# Patient Record
Sex: Female | Born: 2000 | Race: Black or African American | Hispanic: No | Marital: Single | Smoking: Never smoker
Health system: Southern US, Community
[De-identification: ages and names within clinical notes are randomized; demographics above are authoritative.]

## PROBLEM LIST (undated history)

## (undated) DIAGNOSIS — J302 Other seasonal allergic rhinitis: Secondary | ICD-10-CM

## (undated) HISTORY — DX: Other seasonal allergic rhinitis: J30.2

---
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
## 2018-05-04 ENCOUNTER — Ambulatory Visit (INDEPENDENT_AMBULATORY_CARE_PROVIDER_SITE_OTHER): Payer: Medicaid Other | Admitting: Certified Nurse Midwife

## 2018-05-04 ENCOUNTER — Encounter: Payer: Self-pay | Admitting: Certified Nurse Midwife

## 2018-05-04 VITALS — BP 125/82 | HR 71 | Resp 16 | Ht 66.0 in | Wt 176.8 lb

## 2018-05-04 DIAGNOSIS — Z3202 Encounter for pregnancy test, result negative: Secondary | ICD-10-CM | POA: Diagnosis not present

## 2018-05-04 DIAGNOSIS — Z3009 Encounter for other general counseling and advice on contraception: Secondary | ICD-10-CM

## 2018-05-04 DIAGNOSIS — Z30017 Encounter for initial prescription of implantable subdermal contraceptive: Secondary | ICD-10-CM | POA: Diagnosis not present

## 2018-05-04 LAB — POCT URINE PREGNANCY: Preg Test, Ur: NEGATIVE

## 2018-05-04 MED ORDER — ETONOGESTREL 68 MG ~~LOC~~ IMPL
68.0000 mg | DRUG_IMPLANT | Freq: Once | SUBCUTANEOUS | Status: AC
  Administered 2018-05-04: 68 mg via SUBCUTANEOUS

## 2018-05-04 NOTE — Progress Notes (Signed)
Patient is here to establish care - past care at [facility] and for birth control. Patient reports being on Depo Provera but would like to switch to Nexplanon. Patient confirmed she has not had IC in the last 14 days. Hcg urine: Negative. MBM,CMA

## 2018-05-04 NOTE — Patient Instructions (Signed)
Nexplanon Instructions After Insertion   Keep bandage clean and dry for 24 hours   May use ice/Tylenol/Ibuprofen for soreness or pain   If you develop fever, drainage or increased warmth from incision site-contact office immediately Etonogestrel implant What is this medicine? ETONOGESTREL (et oh noe JES trel) is a contraceptive (birth control) device. It is used to prevent pregnancy. It can be used for up to 3 years. This medicine may be used for other purposes; ask your health care provider or pharmacist if you have questions. COMMON BRAND NAME(S): Implanon, Nexplanon What should I tell my health care provider before I take this medicine? They need to know if you have any of these conditions: -abnormal vaginal bleeding -blood vessel disease or blood clots -breast, cervical, endometrial, ovarian, liver, or uterine cancer -diabetes -gallbladder disease -heart disease or recent heart attack -high blood pressure -high cholesterol or triglycerides -kidney disease -liver disease -migraine headaches -seizures -stroke -tobacco smoker -an unusual or allergic reaction to etonogestrel, anesthetics or antiseptics, other medicines, foods, dyes, or preservatives -pregnant or trying to get pregnant -breast-feeding How should I use this medicine? This device is inserted just under the skin on the inner side of your upper arm by a health care professional. Talk to your pediatrician regarding the use of this medicine in children. Special care may be needed. Overdosage: If you think you have taken too much of this medicine contact a poison control center or emergency room at once. NOTE: This medicine is only for you. Do not share this medicine with others. What if I miss a dose? This does not apply. What may interact with this medicine? Do not take this medicine with any of the following medications: -amprenavir -fosamprenavir This medicine may also interact with the following  medications: -acitretin -aprepitant -armodafinil -bexarotene -bosentan -carbamazepine -certain medicines for fungal infections like fluconazole, ketoconazole, itraconazole and voriconazole -certain medicines to treat hepatitis, HIV or AIDS -cyclosporine -felbamate -griseofulvin -lamotrigine -modafinil -oxcarbazepine -phenobarbital -phenytoin -primidone -rifabutin -rifampin -rifapentine -St. John's wort -topiramate This list may not describe all possible interactions. Give your health care provider a list of all the medicines, herbs, non-prescription drugs, or dietary supplements you use. Also tell them if you smoke, drink alcohol, or use illegal drugs. Some items may interact with your medicine. What should I watch for while using this medicine? This product does not protect you against HIV infection (AIDS) or other sexually transmitted diseases. You should be able to feel the implant by pressing your fingertips over the skin where it was inserted. Contact your doctor if you cannot feel the implant, and use a non-hormonal birth control method (such as condoms) until your doctor confirms that the implant is in place. Contact your doctor if you think that the implant may have broken or become bent while in your arm. You will receive a user card from your health care provider after the implant is inserted. The card is a record of the location of the implant in your upper arm and when it should be removed. Keep this card with your health records. What side effects may I notice from receiving this medicine? Side effects that you should report to your doctor or health care professional as soon as possible: -allergic reactions like skin rash, itching or hives, swelling of the face, lips, or tongue -breast lumps, breast tissue changes, or discharge -breathing problems -changes in emotions or moods -if you feel that the implant may have broken or bent while in your arm -high blood    pressure -pain, irritation, swelling, or bruising at the insertion site -scar at site of insertion -signs of infection at the insertion site such as fever, and skin redness, pain or discharge -signs and symptoms of a blood clot such as breathing problems; changes in vision; chest pain; severe, sudden headache; pain, swelling, warmth in the leg; trouble speaking; sudden numbness or weakness of the face, arm or leg -signs and symptoms of liver injury like dark yellow or Niven urine; general ill feeling or flu-like symptoms; light-colored stools; loss of appetite; nausea; right upper belly pain; unusually weak or tired; yellowing of the eyes or skin -unusual vaginal bleeding, discharge Side effects that usually do not require medical attention (report to your doctor or health care professional if they continue or are bothersome): -acne -breast pain or tenderness -headache -irregular menstrual bleeding -nausea This list may not describe all possible side effects. Call your doctor for medical advice about side effects. You may report side effects to FDA at 1-800-FDA-1088. Where should I keep my medicine? This drug is given in a hospital or clinic and will not be stored at home. NOTE: This sheet is a summary. It may not cover all possible information. If you have questions about this medicine, talk to your doctor, pharmacist, or health care provider.  2019 Elsevier/Gold Standard (2017-01-04 14:11:42)   

## 2018-05-04 NOTE — Progress Notes (Signed)
GYNECOLOGY ANNUAL PREVENTATIVE CARE ENCOUNTER NOTE  History:     Beth Phillips is a 18 y.o. G0P0. female here for a new patient gynecologic exam and birth control counseling.  Current complaints: none. Denies abnormal vaginal bleeding, discharge, pelvic pain, problems with intercourse or other gynecologic concerns.    Gynecologic History Patient's last menstrual period was 04/10/2018 (approximate). Contraception: none Has never had a pap smear, <19yo   Obstetric History OB History  No obstetric history on file.    Past Medical History:  Diagnosis Date  . Seasonal allergies     History reviewed. No pertinent surgical history.  Current Outpatient Medications on File Prior to Visit  Medication Sig Dispense Refill  . medroxyPROGESTERone (DEPO-PROVERA) 150 MG/ML injection Inject 150 mg into the muscle every 3 (three) months.     No current facility-administered medications on file prior to visit.     Allergies  Allergen Reactions  . Amoxicillin Anaphylaxis    Social History:  reports that she has never smoked. She has never used smokeless tobacco. She reports that she does not drink alcohol or use drugs.  History reviewed. No pertinent family history.  The following portions of the patient's history were reviewed and updated as appropriate: allergies, current medications, past family history, past medical history, past social history, past surgical history and problem list.  Review of Systems Pertinent items noted in HPI and remainder of comprehensive ROS otherwise negative.  Physical Exam:  BP 125/82 (BP Location: Right Arm, Patient Position: Sitting, Cuff Size: Normal)   Pulse 71   Resp 16   Ht 5\' 6"  (1.676 m)   Wt 176 lb 12.8 oz (80.2 kg)   LMP 04/10/2018 (Approximate)   BMI 28.54 kg/m  CONSTITUTIONAL: Well-developed, well-nourished female in no acute distress.  HENT:  Normocephalic, atraumatic, External right and left ear normal. Oropharynx is clear and  moist EYES: Conjunctivae and EOM are normal. Pupils are equal, round, and reactive to light. NECK: Normal range of motion, supple, no masses.  Normal thyroid.  SKIN: Skin is warm and dry. No rash noted. Not diaphoretic. No erythema. No pallor. MUSCULOSKELETAL: Normal range of motion. No tenderness.  No cyanosis, clubbing, or edema.  2+ distal pulses. NEUROLOGIC: Alert and oriented to person, place, and time. Normal reflexes, muscle tone coordination. No cranial nerve deficit noted. PSYCHIATRIC: Normal mood and affect. Normal behavior. Normal judgment and thought content. CARDIOVASCULAR: Normal heart rate noted, regular rhythm RESPIRATORY: Clear to auscultation bilaterally. Effort and breath sounds normal, no problems with respiration noted. ABDOMEN: Soft, normal bowel sounds, no distention noted.  No tenderness, rebound or guarding.      GYNECOLOGY CLINIC PROCEDURE NOTE Nexplanon Insertion Procedure  Patient was given informed consent, she signed consent form.  Patient does understand that irregular bleeding is a very common side effect of this medication. She was advised to have backup contraception for one week after placement. Pregnancy test in clinic today was negative.  Appropriate time out taken.  Patient's left arm was prepped and draped in the usual sterile fashion.. The ruler used to measure and mark insertion area.  Patient was prepped with alcohol swab and then injected with 3 ml of 1% lidocaine.  She was prepped with betadine, Nexplanon removed from packaging,  Device confirmed in needle, then inserted full length of needle and withdrawn per handbook instructions. Nexplanon was able to palpated in the patient's arm; patient palpated the insert herself. There was minimal blood loss.  Patient insertion site covered with  guaze and a pressure bandage to reduce any bruising.  The patient tolerated the procedure well and was given post procedure instructions.   Assessment and Plan:    1.  Birth control counseling - Educated and discussed birth control options in detail with OCPs vs LARCs. Patient was on depo for birth control in past but stopped in June of last year. She does not want to be on OCPs as she will forget to take medication daily. Patient decided on Nexplanon for contraception  - POCT urine pregnancy  2. Encounter for initial prescription of Nexplanon - etonogestrel (NEXPLANON) implant 68 mg  Routine preventative health maintenance measures emphasized. Please refer to After Visit Summary for other counseling recommendations.     Sharyon Cable, CNM [facility] [employer]

## 2018-05-08 ENCOUNTER — Ambulatory Visit (INDEPENDENT_AMBULATORY_CARE_PROVIDER_SITE_OTHER): Payer: Medicaid Other

## 2018-05-08 ENCOUNTER — Encounter (HOSPITAL_COMMUNITY): Payer: Self-pay | Admitting: Emergency Medicine

## 2018-05-08 ENCOUNTER — Ambulatory Visit (HOSPITAL_COMMUNITY)
Admission: EM | Admit: 2018-05-08 | Discharge: 2018-05-08 | Disposition: A | Discharge status: Home / Self Care | Payer: Medicaid Other | Attending: Family Medicine | Admitting: Family Medicine

## 2018-05-08 ENCOUNTER — Other Ambulatory Visit: Payer: Self-pay

## 2018-05-08 DIAGNOSIS — M62830 Muscle spasm of back: Secondary | ICD-10-CM

## 2018-05-08 DIAGNOSIS — M545 Low back pain, unspecified: Secondary | ICD-10-CM

## 2018-05-08 DIAGNOSIS — M546 Pain in thoracic spine: Secondary | ICD-10-CM

## 2018-05-08 MED ORDER — NAPROXEN 500 MG PO TABS: 500.0000 mg | ORAL_TABLET | Freq: Two times a day (BID) | ORAL | 0 refills | Status: DC

## 2018-05-08 MED ORDER — METHOCARBAMOL 500 MG PO TABS: 500.0000 mg | ORAL_TABLET | Freq: Two times a day (BID) | ORAL | 0 refills | Status: DC
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
## 2018-05-08 NOTE — Discharge Instructions (Signed)
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
## 2018-05-11 ENCOUNTER — Encounter (HOSPITAL_COMMUNITY): Payer: Self-pay | Admitting: Emergency Medicine

## 2018-05-11 ENCOUNTER — Emergency Department (HOSPITAL_COMMUNITY): Payer: No Typology Code available for payment source

## 2018-05-11 ENCOUNTER — Emergency Department (HOSPITAL_COMMUNITY)
Admission: EM | Admit: 2018-05-11 | Discharge: 2018-05-11 | Disposition: A | Discharge status: Home / Self Care | Payer: No Typology Code available for payment source | Attending: Emergency Medicine | Admitting: Emergency Medicine

## 2018-05-11 DIAGNOSIS — M549 Dorsalgia, unspecified: Secondary | ICD-10-CM | POA: Diagnosis present

## 2018-05-11 LAB — POC URINE PREG, ED: PREG TEST UR: NEGATIVE

## 2018-05-11 MED ORDER — CYCLOBENZAPRINE HCL 10 MG PO TABS: 10.0000 mg | ORAL_TABLET | Freq: Two times a day (BID) | ORAL | 0 refills | Status: AC | PRN

## 2018-05-11 MED ORDER — IBUPROFEN 600 MG PO TABS: 600.0000 mg | ORAL_TABLET | Freq: Four times a day (QID) | ORAL | 0 refills | Status: AC | PRN
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
## 2018-10-02 ENCOUNTER — Emergency Department (HOSPITAL_COMMUNITY): Payer: Medicaid Other

## 2018-10-02 ENCOUNTER — Other Ambulatory Visit: Payer: Self-pay

## 2018-10-02 ENCOUNTER — Encounter (HOSPITAL_COMMUNITY): Payer: Self-pay | Admitting: Emergency Medicine

## 2018-10-02 ENCOUNTER — Emergency Department (HOSPITAL_COMMUNITY)
Admission: EM | Admit: 2018-10-02 | Discharge: 2018-10-02 | Disposition: A | Discharge status: Home / Self Care | Payer: Medicaid Other | Attending: Emergency Medicine | Admitting: Emergency Medicine

## 2018-10-02 DIAGNOSIS — W228XXA Striking against or struck by other objects, initial encounter: Secondary | ICD-10-CM | POA: Insufficient documentation

## 2018-10-02 DIAGNOSIS — Z23 Encounter for immunization: Secondary | ICD-10-CM | POA: Insufficient documentation

## 2018-10-02 DIAGNOSIS — L03011 Cellulitis of right finger: Secondary | ICD-10-CM | POA: Insufficient documentation

## 2018-10-02 DIAGNOSIS — M79644 Pain in right finger(s): Secondary | ICD-10-CM | POA: Diagnosis present

## 2018-10-02 MED ORDER — TETANUS-DIPHTH-ACELL PERTUSSIS 5-2.5-18.5 LF-MCG/0.5 IM SUSP
0.5000 mL | Freq: Once | INTRAMUSCULAR | Status: AC
  Administered 2018-10-02: 22:00:00 0.5 mL via INTRAMUSCULAR
  Filled 2018-10-02: qty 0.5

## 2018-10-02 MED ORDER — BUPIVACAINE HCL (PF) 0.5 % IJ SOLN
10.0000 mL | Freq: Once | INTRAMUSCULAR | Status: AC
  Administered 2018-10-02: 22:00:00 10 mL
  Filled 2018-10-02: qty 30

## 2018-10-02 NOTE — ED Provider Notes (Signed)
[facility]-EMERGENCY DEPT Provider Note   CSN: [id number] Arrival date & time: 10/02/18  1938    History   Chief Complaint Chief Complaint  Patient presents with  . Finger Injury    HPI Beth Phillips is a 18 y.o. female.     HPI   Beth Phillips is a 18 y.o. female, patient with no pertinent past medical history, presenting to the ED with pain and swelling to the distal right middle finger beginning 2 days ago. She states she then hit it with a box at work today, which caused much more pain.  Pain is throbbing and burning, moderate to severe, radiating proximally.  Denies fever/chills, numbness, weakness, drainage from the finger, or any other complaints.      Past Medical History:  Diagnosis Date  . Seasonal allergies     Patient Active Problem List   Diagnosis Date Noted  . Chest wall pain 05/08/2015  . Dyspnea 05/08/2015    History reviewed. No pertinent surgical history.   OB History   No obstetric history on file.      Home Medications    Prior to Admission medications   Medication Sig Start Date End Date Taking? Authorizing Provider  cyclobenzaprine (FLEXERIL) 10 MG tablet Take 1 tablet (10 mg total) by mouth 2 (two) times daily as needed for muscle spasms. 05/11/18   Aviva KluverMurray, Alyssa B, PA-C  ibuprofen (ADVIL,MOTRIN) 600 MG tablet Take 1 tablet (600 mg total) by mouth every 6 (six) hours as needed. 05/11/18   Elisha PonderMurray, Alyssa B, PA-C    Family History Family History  Problem Relation Age of Onset  . Healthy Mother   . Healthy Father     Social History Social History   Tobacco Use  . Smoking status: Never Smoker  . Smokeless tobacco: Never Used  Substance Use Topics  . Alcohol use: No    Alcohol/week: 0.0 standard drinks  . Drug use: No     Allergies   Amoxicillin   Review of Systems Review of Systems  Constitutional: Negative for fever.  Musculoskeletal: Positive for arthralgias.  Neurological: Negative for  weakness and numbness.     Physical Exam Updated Vital Signs BP 134/89 (BP Location: Right Arm)   Pulse 80   Temp 98.6 F (37 C) (Oral)   Resp 18   LMP 05/02/2018 Comment: implant  SpO2 97%   Physical Exam Vitals signs and nursing note reviewed.  Constitutional:      General: She is not in acute distress.    Appearance: She is well-developed. She is not diaphoretic.  HENT:     Head: Normocephalic and atraumatic.  Eyes:     Conjunctiva/sclera: Conjunctivae normal.  Neck:     Musculoskeletal: Neck supple.  Cardiovascular:     Rate and Rhythm: Normal rate and regular rhythm.  Pulmonary:     Effort: Pulmonary effort is normal.  Musculoskeletal:     Comments: Tenderness and swelling to the right middle finger, especially on the ulnar side of the nail.  Noted fullness on the palmar side.  Full range of motion intact in the DIP, PIP, and MCP joints in the fingers of the right hand. No erythema noted to the finger.  No tenderness or swelling proximally.  Skin:    General: Skin is warm and dry.     Capillary Refill: Capillary refill takes less than 2 seconds.     Coloration: Skin is not pale.  Neurological:     Mental  Status: She is alert.     Comments: Sensation to light touch grossly intact in the fingers of the right hand. Appropriate motor function intact.  Psychiatric:        Behavior: Behavior normal.      ED Treatments / Results  Labs (all labs ordered are listed, but only abnormal results are displayed) Labs Reviewed - No data to display  EKG None  Radiology Dg Finger Middle Right  Result Date: 10/02/2018 CLINICAL DATA:  Crush injury at work.  Pain and swelling. EXAM: RIGHT MIDDLE FINGER 2+V COMPARISON:  04/22/2016 FINDINGS: Distal soft tissue swelling but no evidence of fracture or dislocation. IMPRESSION: Distal soft tissue swelling. Electronically Signed   By: Paulina FusiMark  Shogry M.D.   On: 10/02/2018 20:22    Procedures .Nerve Block  Date/Time: 10/02/2018 10:20  PM Performed by: Anselm PancoastJoy, Hennie Gosa C, PA-C Authorized by: Anselm PancoastJoy, Alfretta Pinch C, PA-C   Consent:    Consent obtained:  Verbal   Consent given by:  Patient   Risks discussed:  Swelling, unsuccessful block and pain Indications:    Indications:  Procedural anesthesia Location:    Body area:  Upper extremity   Upper extremity nerve blocked: Digital, middle finger.   Laterality:  Right Pre-procedure details:    Skin preparation:  Alcohol Procedure details (see MAR for exact dosages):    Block needle gauge:  25 G   Anesthetic injected:  Bupivacaine 0.5% w/o epi   Injection procedure:  Anatomic landmarks identified, anatomic landmarks palpated, incremental injection, introduced needle and negative aspiration for blood Post-procedure details:    Outcome:  Anesthesia achieved   Patient tolerance of procedure:  Tolerated well, no immediate complications  Drain paronychia  Date/Time: 10/02/2018 11:15 PM Performed by: Anselm PancoastJoy, Maxie Slovacek C, PA-C Authorized by: Anselm PancoastJoy, Genevia Bouldin C, PA-C  Consent: Verbal consent obtained. Risks and benefits: risks, benefits and alternatives were discussed Consent given by: patient Patient identity confirmed: verbally with patient and provided demographic data Local anesthesia used: yes Anesthesia: digital block  Anesthesia: Local anesthesia used: yes Local Anesthetic: bupivacaine 0.5% without epinephrine  Sedation: Patient sedated: no  Patient tolerance: patient tolerated the procedure well with no immediate complications Comments: Incision along the ulnar side of the nail produced small amount of purulent discharge.  An incision was also made on the ulnar side of the pulp part of the finger to assess for possible felon.  There was no purulence noted from this incision.    (including critical care time)  Medications Ordered in ED Medications  bupivacaine (MARCAINE) 0.5 % injection 10 mL (10 mLs Infiltration Given 10/02/18 2200)  Tdap (BOOSTRIX) injection 0.5 mL (0.5 mLs  Intramuscular Given 10/02/18 2200)     Initial Impression / Assessment and Plan / ED Course  I have reviewed the triage vital signs and the nursing notes.  Pertinent labs & imaging results that were available during my care of the patient were reviewed by me and considered in my medical decision making (see chart for details).        Patient presents with finger swelling and pain for the last several days.  Evidence of paronychia versus felon on initial exam.  Following I&D, suspect a paronychia alone without felon.  Low suspicion for surrounding cellulitis. The patient was given instructions for home care as well as return precautions. Patient voices understanding of these instructions, accepts the plan, and is comfortable with discharge.  Final Clinical Impressions(s) / ED Diagnoses   Final diagnoses:  Paronychia of right middle finger  ED Discharge Orders    None       Layla Maw 10/02/18 2337    Drenda Freeze, MD 10/03/18 [phone]

## 2018-10-02 NOTE — ED Triage Notes (Signed)
Patient here from home with complaints of right middle finger injury. Reports that she dropped a box on it while at work.

## 2018-10-02 NOTE — Discharge Instructions (Signed)
Wound Care - After I&D of Abscess  You may remove the bandage after 24 hours.  The only reason to replace the bandage is to protect clothing from drainage. Bandages, if used, should be replaced daily or whenever soiled. The wound may continue to drain for the next 2-3 days.   Cleaning: Clean the wound and surrounding area gently with tap water and mild soap. Rinse well and blot dry. You may shower normally. Soaking the wound in Epsom salt baths for no more than 15 minutes once a day may help rinse out any remaining pus and help with wound healing.  Clean the wound daily to prevent further infection. Do not use cleaners such as hydrogen peroxide or alcohol.   Scar reduction: Application of a topical antibiotic ointment, such as Neosporin, after the wound has begun to close and heal well can decrease scab formation and reduce scarring. After the wound has healed, application of ointments such as Aquaphor can also reduce scar formation.  The key to scar reduction is keeping the skin well hydrated and supple. Drinking plenty of water throughout the day (At least eight 8oz glasses of water a day) is essential to staying well hydrated.  Sun exposure: Keep the wound out of the sun. After the wound has healed, continue to protect it from the sun by wearing protective clothing or applying sunscreen.  Pain: You may use Tylenol, naproxen, or ibuprofen for pain.  Prevention: There is some people that have a predisposition to abscess formation, however, there are some things that can be done to prevent abscesses in many people.  Most abscesses form because bacteria that naturally lives on the skin gets trapped underneath the skin.  This can occur through openings too small to see. Do not bite your nails. Before and after any area of skin is shaved, wax, or abraded in any manner, the area should be washed with soap and water and rinsed well.   If you are having trouble with recurrent abscesses, it may be wise to  perform a chlorhexidine wash regimen.  For 1 week, wash all of your body with chlorhexidine (available over-the-counter at most pharmacies). You may also need to reevaluate your use of daily soap as soaps with perfumes or dyes can increase the chances of infection in some people.  Follow up: Please return to the ED or go to your primary care provider in 2-3 days for a wound check to assure proper healing.  Return: Return to the ED sooner should signs of worsening infection arise, such as spreading redness, worsening puffiness/swelling, severe increase in pain, fever over 100.37F, or any other major issues.  For prescription assistance, may try using prescription discount sites or apps, such as goodrx.com

## 2018-10-03 NOTE — ED Notes (Signed)
Opened chart to answer question regarding pain meds and wound care.

## 2019-10-12 ENCOUNTER — Ambulatory Visit (HOSPITAL_COMMUNITY)
Admission: EM | Admit: 2019-10-12 | Discharge: 2019-10-12 | Disposition: A | Discharge status: Home / Self Care | Payer: Medicaid Other | Attending: Urgent Care | Admitting: Urgent Care

## 2019-10-12 ENCOUNTER — Other Ambulatory Visit: Payer: Self-pay

## 2019-10-12 ENCOUNTER — Encounter (HOSPITAL_COMMUNITY): Payer: Self-pay

## 2019-10-12 DIAGNOSIS — J3489 Other specified disorders of nose and nasal sinuses: Secondary | ICD-10-CM

## 2019-10-12 DIAGNOSIS — J019 Acute sinusitis, unspecified: Secondary | ICD-10-CM

## 2019-10-12 DIAGNOSIS — R519 Headache, unspecified: Secondary | ICD-10-CM

## 2019-10-12 DIAGNOSIS — H9203 Otalgia, bilateral: Secondary | ICD-10-CM

## 2019-10-12 DIAGNOSIS — R059 Cough, unspecified: Secondary | ICD-10-CM

## 2019-10-12 DIAGNOSIS — R05 Cough: Secondary | ICD-10-CM

## 2019-10-12 MED ORDER — DOXYCYCLINE HYCLATE 100 MG PO CAPS: 100.0000 mg | ORAL_CAPSULE | Freq: Two times a day (BID) | ORAL | 0 refills | Status: AC

## 2019-10-12 MED ORDER — CETIRIZINE HCL 10 MG PO TABS: 10.0000 mg | ORAL_TABLET | Freq: Every day | ORAL | 0 refills | Status: DC

## 2019-10-12 MED ORDER — PROMETHAZINE-DM 6.25-15 MG/5ML PO SYRP: 5.0000 mL | ORAL_SOLUTION | Freq: Every evening | ORAL | 0 refills | Status: AC | PRN

## 2019-10-12 MED ORDER — BENZONATATE 100 MG PO CAPS: 100.0000 mg | ORAL_CAPSULE | Freq: Three times a day (TID) | ORAL | 0 refills | Status: AC | PRN

## 2019-10-12 MED ORDER — PSEUDOEPHEDRINE HCL 60 MG PO TABS: 60.0000 mg | ORAL_TABLET | Freq: Three times a day (TID) | ORAL | 0 refills | Status: AC | PRN

## 2019-10-12 NOTE — ED Provider Notes (Signed)
[facility]   MRN: [id number] DOB: [birth year]  Subjective:   LIBA HULSEY is a 19 y.o. female presenting for 7 to 8-day history of persistent cough, sore throat, sinus headaches, persistent runny and stuffy nose, intermittent bilateral ear pain in the past few days, right worse than left.  She had a negative Covid test on Tuesday at another clinic.  Has been using TheraFlu, NyQuil, DayQuil without relief.  Has a history of seasonal allergies.  No current facility-administered medications for this encounter.  Current Outpatient Medications:  .  cyclobenzaprine (FLEXERIL) 10 MG tablet, Take 1 tablet (10 mg total) by mouth 2 (two) times daily as needed for muscle spasms., Disp: 14 tablet, Rfl: 0 .  ibuprofen (ADVIL,MOTRIN) 600 MG tablet, Take 1 tablet (600 mg total) by mouth every 6 (six) hours as needed., Disp: 30 tablet, Rfl: 0   Allergies  Allergen Reactions  . Amoxicillin Anaphylaxis    Past Medical History:  Diagnosis Date  . Seasonal allergies      History reviewed. No pertinent surgical history.  Family History  Problem Relation Age of Onset  . Healthy Mother   . Healthy Father     Social History   Tobacco Use  . Smoking status: Never Smoker  . Smokeless tobacco: Never Used  Substance Use Topics  . Alcohol use: No    Alcohol/week: 0.0 standard drinks  . Drug use: No    ROS   Objective:   Vitals: BP 132/82 (BP Location: Left Arm)   Pulse 95   Temp 99.2 F (37.3 C) (Oral)   Resp 18   SpO2 100%   Physical Exam Constitutional:      General: She is not in acute distress.    Appearance: Normal appearance. She is well-developed. She is not ill-appearing, toxic-appearing or diaphoretic.  HENT:     Head: Normocephalic and atraumatic.     Right Ear: Tympanic membrane and ear canal normal. No drainage or tenderness. No middle ear effusion. Tympanic membrane is not erythematous.     Left Ear: Tympanic membrane and ear canal normal. No drainage or  tenderness.  No middle ear effusion. Tympanic membrane is not erythematous.     Nose: Congestion and rhinorrhea present.     Mouth/Throat:     Mouth: Mucous membranes are moist. No oral lesions.     Pharynx: No pharyngeal swelling, oropharyngeal exudate, posterior oropharyngeal erythema or uvula swelling.     Tonsils: No tonsillar exudate or tonsillar abscesses.  Eyes:     Extraocular Movements: Extraocular movements intact.     Right eye: Normal extraocular motion.     Left eye: Normal extraocular motion.     Conjunctiva/sclera: Conjunctivae normal.     Pupils: Pupils are equal, round, and reactive to light.  Cardiovascular:     Rate and Rhythm: Normal rate and regular rhythm.     Pulses: Normal pulses.     Heart sounds: Normal heart sounds. No murmur heard.  No friction rub. No gallop.   Pulmonary:     Effort: Pulmonary effort is normal. No respiratory distress.     Breath sounds: Normal breath sounds. No stridor. No wheezing, rhonchi or rales.  Musculoskeletal:     Cervical back: Normal range of motion and neck supple.  Lymphadenopathy:     Cervical: No cervical adenopathy.  Skin:    General: Skin is warm and dry.     Findings: No rash.  Neurological:     General: No focal deficit present.  Mental Status: She is alert and oriented to person, place, and time.  Psychiatric:        Mood and Affect: Mood normal.        Behavior: Behavior normal.        Thought Content: Thought content normal.       Assessment and Plan :   PDMP not reviewed this encounter.  1. Acute sinusitis, recurrence not specified, unspecified location   2. Stuffy and runny nose   3. Acute ear pain, bilateral   4. Cough   5. Sinus headache     Will start empiric treatment for sinusitis with doxycycline.  Recommended supportive care otherwise including the use of oral antihistamine, decongestant. Counseled patient on potential for adverse effects with medications prescribed/recommended today, ER  and return-to-clinic precautions discussed, patient verbalized understanding.    Wallis Bamberg, PA-C 10/12/19 1142

## 2019-10-12 NOTE — ED Triage Notes (Signed)
Pt presents with ongoing non productive cough, sore throat, headache, and right ear pain for past few days; pt had negative covid test on Tuesday.

## 2019-11-03 ENCOUNTER — Other Ambulatory Visit: Payer: Self-pay

## 2019-11-03 ENCOUNTER — Encounter (HOSPITAL_COMMUNITY): Payer: Self-pay | Admitting: Gynecology

## 2019-11-03 ENCOUNTER — Ambulatory Visit (HOSPITAL_COMMUNITY)
Admission: EM | Admit: 2019-11-03 | Discharge: 2019-11-03 | Disposition: A | Discharge status: Home / Self Care | Payer: Medicaid Other | Attending: Emergency Medicine | Admitting: Emergency Medicine

## 2019-11-03 DIAGNOSIS — R0981 Nasal congestion: Secondary | ICD-10-CM

## 2019-11-03 DIAGNOSIS — Z20822 Contact with and (suspected) exposure to covid-19: Secondary | ICD-10-CM | POA: Insufficient documentation

## 2019-11-03 DIAGNOSIS — J309 Allergic rhinitis, unspecified: Secondary | ICD-10-CM | POA: Insufficient documentation

## 2019-11-03 DIAGNOSIS — Z880 Allergy status to penicillin: Secondary | ICD-10-CM | POA: Insufficient documentation

## 2019-11-03 DIAGNOSIS — Z791 Long term (current) use of non-steroidal anti-inflammatories (NSAID): Secondary | ICD-10-CM | POA: Diagnosis not present

## 2019-11-03 DIAGNOSIS — Z79899 Other long term (current) drug therapy: Secondary | ICD-10-CM | POA: Insufficient documentation

## 2019-11-03 MED ORDER — FLUTICASONE PROPIONATE 50 MCG/ACT NA SUSP: 1.0000 | Freq: Every day | NASAL | 2 refills | Status: AC

## 2019-11-03 MED ORDER — IPRATROPIUM BROMIDE 0.06 % NA SOLN: 2.0000 | Freq: Four times a day (QID) | NASAL | 12 refills | Status: AC | PRN

## 2019-11-03 MED ORDER — CETIRIZINE HCL 10 MG PO TABS: 10.0000 mg | ORAL_TABLET | Freq: Every day | ORAL | 0 refills | Status: AC

## 2019-11-03 NOTE — Discharge Instructions (Addendum)
Push fluids to ensure adequate hydration and keep secretions thin.  Tylenol and/or ibuprofen as needed for pain or fevers.  Please restart daily zyrtec.  Daily flonase.  Use of atrovent nasal spray every 6 hours as needed for congestion.  If symptoms worsen or do not improve in the next week to return to be seen or to follow up with your PCP.  Self isolate until covid results are back and negative.  Will notify you by phone of any positive findings. Your negative results will be sent through your MyChart.

## 2019-11-03 NOTE — ED Triage Notes (Signed)
Pt presents with c/o recurrent sinus congestion. Pt was seen and treated for a sinus infection about 2 weeks ago. She did complete the abx and had improved some. She states her sinus congestion and headache returned 2 days ago. Pt also reports a raw feeling in her throat. Pt denies any known sick contacts.

## 2019-11-03 NOTE — ED Provider Notes (Signed)
[facility]    CSN: [id number] Arrival date & time: 11/03/19  1436      History   Chief Complaint Chief Complaint  Patient presents with   Recurrent Sinusitis    HPI Beth Phillips is a 19 y.o. female.   Beth Phillips presents with complaints of sinus congestion as well as right ear pain. This started yesterday. Sore throat, pain with swallowing. No fevers. Minimal cough. No shortness of breath or chest pain. No outward nasal drainage, but congestion and pressure. No gi symptoms. No known ill contacts. Had covid-19 in November 2020, no vaccination since. Was seen here 8/13 with similar symptoms, was given antibiotics which did help and symptoms improved. Symptoms returned yesterday. Has taken ibuprofen which has helped with headache. History of allergies. Not taking allergy medication.   ROS per HPI, negative if not otherwise mentioned.      Past Medical History:  Diagnosis Date   Seasonal allergies     Patient Active Problem List   Diagnosis Date Noted   Chest wall pain 05/08/2015   Dyspnea 05/08/2015    History reviewed. No pertinent surgical history.  OB History   No obstetric history on file.      Home Medications    Prior to Admission medications   Medication Sig Start Date End Date Taking? Authorizing Provider  etonogestrel (NEXPLANON) 68 MG IMPL implant 1 each by Subdermal route once.   Yes [provider]  ibuprofen (ADVIL,MOTRIN) 600 MG tablet Take 1 tablet (600 mg total) by mouth every 6 (six) hours as needed. 05/11/18  Yes [place] Scrape, Alyssa B, PA-C  benzonatate (TESSALON) 100 MG capsule Take 1-2 capsules (100-200 mg total) by mouth 3 (three) times daily as needed for cough. 10/12/19   Wallis Bamberg, PA-C  cetirizine (ZYRTEC ALLERGY) 10 MG tablet Take 1 tablet (10 mg total) by mouth daily. 11/03/19   Georgetta Haber, NP  cyclobenzaprine (FLEXERIL) 10 MG tablet Take 1 tablet (10 mg total) by mouth 2 (two) times daily as needed for muscle  spasms. 05/11/18   Aviva Kluver B, PA-C  doxycycline (VIBRAMYCIN) 100 MG capsule Take 1 capsule (100 mg total) by mouth 2 (two) times daily. 10/12/19   Wallis Bamberg, PA-C  fluticasone (FLONASE) 50 MCG/ACT nasal spray Place 1 spray into both nostrils daily. 11/03/19   Linus Mako B, NP  ipratropium (ATROVENT) 0.06 % nasal spray Place 2 sprays into both nostrils 4 (four) times daily as needed for rhinitis. 11/03/19   Georgetta Haber, NP  promethazine-dextromethorphan (PROMETHAZINE-DM) 6.25-15 MG/5ML syrup Take 5 mLs by mouth at bedtime as needed for cough. 10/12/19   Wallis Bamberg, PA-C  pseudoephedrine (SUDAFED) 60 MG tablet Take 1 tablet (60 mg total) by mouth every 8 (eight) hours as needed for congestion. 10/12/19   Wallis Bamberg, PA-C    Family History Family History  Problem Relation Age of Onset   Healthy Mother    Healthy Father     Social History Social History   Tobacco Use   Smoking status: Never Smoker   Smokeless tobacco: Never Used  Substance Use Topics   Alcohol use: No    Alcohol/week: 0.0 standard drinks   Drug use: No     Allergies   Amoxicillin   Review of Systems Review of Systems   Physical Exam Triage Vital Signs ED Triage Vitals  Enc Vitals Group     BP 11/03/19 1700 120/74     Pulse Rate 11/03/19 1700 98  Resp 11/03/19 1700 16     Temp 11/03/19 1700 98.7 F (37.1 C)     Temp Source 11/03/19 1700 Oral     SpO2 11/03/19 1700 100 %     Weight 11/03/19 1723 165 lb (74.8 kg)     Height 11/03/19 1723 5\' 6"  (1.676 m)     Head Circumference --      Peak Flow --      Pain Score 11/03/19 1700 6     Pain Loc --      Pain Edu? --      Excl. in GC? --    No data found.  Updated Vital Signs BP 120/74 (BP Location: Left Arm)    Pulse 98    Temp 98.7 F (37.1 C) (Oral)    Resp 16    Ht 5\' 6"  (1.676 m)    Wt 165 lb (74.8 kg)    SpO2 100%    BMI 26.63 kg/m   Visual Acuity Right Eye Distance:   Left Eye Distance:   Bilateral Distance:    Right  Eye Near:   Left Eye Near:    Bilateral Near:     Physical Exam Constitutional:      General: She is not in acute distress.    Appearance: She is well-developed.  HENT:     Right Ear: Tympanic membrane and ear canal normal.     Left Ear: Tympanic membrane and ear canal normal.     Nose: Rhinorrhea present.     Right Turbinates: Swollen.     Left Turbinates: Swollen.     Right Sinus: No maxillary sinus tenderness or frontal sinus tenderness.     Left Sinus: No maxillary sinus tenderness or frontal sinus tenderness.  Cardiovascular:     Rate and Rhythm: Normal rate.  Pulmonary:     Effort: Pulmonary effort is normal.  Skin:    General: Skin is warm and dry.  Neurological:     Mental Status: She is alert and oriented to person, place, and time.      UC Treatments / Results  Labs (all labs ordered are listed, but only abnormal results are displayed) Labs Reviewed  SARS CORONAVIRUS 2 (TAT 6-24 HRS)    EKG   Radiology No results found.  Procedures Procedures (including critical care time)  Medications Ordered in UC Medications - No data to display  Initial Impression / Assessment and Plan / UC Course  I have reviewed the triage vital signs and the nursing notes.  Pertinent labs & imaging results that were available during my care of the patient were reviewed by me and considered in my medical decision making (see chart for details).    Non toxic. Benign physical exam.  Was treated with doxycycline with minimal improvement, symptoms have since returned. Afebrile. Allergic vs viral etiology, no further antibiotics issued today. Return precautions provided. Patient verbalized understanding and agreeable to plan.   Final Clinical Impressions(s) / UC Diagnoses   Final diagnoses:  Sinus congestion  Allergic rhinitis, unspecified seasonality, unspecified trigger     Discharge Instructions     Push fluids to ensure adequate hydration and keep secretions thin.    Tylenol and/or ibuprofen as needed for pain or fevers.  Please restart daily zyrtec.  Daily flonase.  Use of atrovent nasal spray every 6 hours as needed for congestion.  If symptoms worsen or do not improve in the next week to return to be seen or to follow up with your  PCP.  Self isolate until covid results are back and negative.  Will notify you by phone of any positive findings. Your negative results will be sent through your MyChart.          ED Prescriptions    Medication Sig Dispense Auth. Provider   cetirizine (ZYRTEC ALLERGY) 10 MG tablet Take 1 tablet (10 mg total) by mouth daily. 30 tablet Linus Mako B, NP   fluticasone (FLONASE) 50 MCG/ACT nasal spray Place 1 spray into both nostrils daily. 16 g Linus Mako B, NP   ipratropium (ATROVENT) 0.06 % nasal spray Place 2 sprays into both nostrils 4 (four) times daily as needed for rhinitis. 15 mL Georgetta Haber, NP     PDMP not reviewed this encounter.   Georgetta Haber, NP 11/04/19 [phone]

## 2019-11-03 NOTE — ED Triage Notes (Signed)
Patient c/o recurrent sinusitis. Per patient was seen on 10/12/19 for the same symptoms

## 2019-11-04 LAB — SARS CORONAVIRUS 2 (TAT 6-24 HRS): SARS Coronavirus 2: NEGATIVE
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
